# Patient Record
Sex: Female | Born: 2000 | Race: Black or African American | Hispanic: No | Marital: Single | Smoking: Never smoker
Health system: Southern US, Community
[De-identification: ages and names within clinical notes are randomized; demographics above are authoritative.]

## PROBLEM LIST (undated history)

## (undated) DIAGNOSIS — F32A Depression, unspecified: Secondary | ICD-10-CM

## (undated) DIAGNOSIS — F329 Major depressive disorder, single episode, unspecified: Secondary | ICD-10-CM

## (undated) DIAGNOSIS — F419 Anxiety disorder, unspecified: Secondary | ICD-10-CM

## (undated) HISTORY — PX: DILATION AND CURETTAGE OF UTERUS: SHX78

## (undated) HISTORY — DX: Depression, unspecified: F32.A

## (undated) HISTORY — DX: Anxiety disorder, unspecified: F41.9

## (undated) HISTORY — PX: NO PAST SURGERIES: SHX2092

---
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
## 2021-01-13 DIAGNOSIS — Z5181 Encounter for therapeutic drug level monitoring: Secondary | ICD-10-CM | POA: Diagnosis not present

## 2021-02-01 DIAGNOSIS — Z5181 Encounter for therapeutic drug level monitoring: Secondary | ICD-10-CM | POA: Diagnosis not present

## 2021-02-07 DIAGNOSIS — Z5181 Encounter for therapeutic drug level monitoring: Secondary | ICD-10-CM | POA: Diagnosis not present

## 2021-02-13 DIAGNOSIS — Z5181 Encounter for therapeutic drug level monitoring: Secondary | ICD-10-CM | POA: Diagnosis not present

## 2021-02-24 DIAGNOSIS — Z5181 Encounter for therapeutic drug level monitoring: Secondary | ICD-10-CM | POA: Diagnosis not present

## 2021-03-03 DIAGNOSIS — Z5181 Encounter for therapeutic drug level monitoring: Secondary | ICD-10-CM | POA: Diagnosis not present

## 2021-03-15 DIAGNOSIS — Z5181 Encounter for therapeutic drug level monitoring: Secondary | ICD-10-CM | POA: Diagnosis not present

## 2021-03-28 DIAGNOSIS — Z5181 Encounter for therapeutic drug level monitoring: Secondary | ICD-10-CM | POA: Diagnosis not present

## 2021-04-10 DIAGNOSIS — Z5181 Encounter for therapeutic drug level monitoring: Secondary | ICD-10-CM | POA: Diagnosis not present

## 2021-04-26 DIAGNOSIS — Z5181 Encounter for therapeutic drug level monitoring: Secondary | ICD-10-CM | POA: Diagnosis not present

## 2021-05-10 DIAGNOSIS — Z5181 Encounter for therapeutic drug level monitoring: Secondary | ICD-10-CM | POA: Diagnosis not present

## 2021-05-17 DIAGNOSIS — Z5181 Encounter for therapeutic drug level monitoring: Secondary | ICD-10-CM | POA: Diagnosis not present

## 2021-05-25 DIAGNOSIS — Z5181 Encounter for therapeutic drug level monitoring: Secondary | ICD-10-CM | POA: Diagnosis not present

## 2021-06-15 DIAGNOSIS — Z5181 Encounter for therapeutic drug level monitoring: Secondary | ICD-10-CM | POA: Diagnosis not present

## 2021-06-27 DIAGNOSIS — Z5181 Encounter for therapeutic drug level monitoring: Secondary | ICD-10-CM | POA: Diagnosis not present

## 2021-07-18 DIAGNOSIS — Z5181 Encounter for therapeutic drug level monitoring: Secondary | ICD-10-CM | POA: Diagnosis not present

## 2021-08-09 DIAGNOSIS — Z5181 Encounter for therapeutic drug level monitoring: Secondary | ICD-10-CM | POA: Diagnosis not present

## 2021-08-16 DIAGNOSIS — Z5181 Encounter for therapeutic drug level monitoring: Secondary | ICD-10-CM | POA: Diagnosis not present

## 2021-09-05 DIAGNOSIS — Z5181 Encounter for therapeutic drug level monitoring: Secondary | ICD-10-CM | POA: Diagnosis not present

## 2021-09-18 DIAGNOSIS — Z5181 Encounter for therapeutic drug level monitoring: Secondary | ICD-10-CM | POA: Diagnosis not present

## 2021-09-27 DIAGNOSIS — Z5181 Encounter for therapeutic drug level monitoring: Secondary | ICD-10-CM | POA: Diagnosis not present

## 2021-10-30 DIAGNOSIS — Z5181 Encounter for therapeutic drug level monitoring: Secondary | ICD-10-CM | POA: Diagnosis not present

## 2021-11-13 DIAGNOSIS — Z5181 Encounter for therapeutic drug level monitoring: Secondary | ICD-10-CM | POA: Diagnosis not present

## 2022-04-20 DIAGNOSIS — J020 Streptococcal pharyngitis: Secondary | ICD-10-CM | POA: Diagnosis not present

## 2023-03-01 DIAGNOSIS — M25531 Pain in right wrist: Secondary | ICD-10-CM | POA: Diagnosis not present

## 2023-03-01 DIAGNOSIS — S61511A Laceration without foreign body of right wrist, initial encounter: Secondary | ICD-10-CM | POA: Diagnosis not present

## 2023-04-25 DIAGNOSIS — Z3201 Encounter for pregnancy test, result positive: Secondary | ICD-10-CM | POA: Diagnosis not present

## 2023-04-25 DIAGNOSIS — Z124 Encounter for screening for malignant neoplasm of cervix: Secondary | ICD-10-CM | POA: Diagnosis not present

## 2023-04-25 DIAGNOSIS — N926 Irregular menstruation, unspecified: Secondary | ICD-10-CM | POA: Diagnosis not present

## 2023-05-18 DIAGNOSIS — N939 Abnormal uterine and vaginal bleeding, unspecified: Secondary | ICD-10-CM | POA: Diagnosis not present

## 2023-05-18 DIAGNOSIS — R102 Pelvic and perineal pain: Secondary | ICD-10-CM | POA: Diagnosis not present

## 2023-05-18 DIAGNOSIS — Z87891 Personal history of nicotine dependence: Secondary | ICD-10-CM | POA: Diagnosis not present

## 2023-05-18 DIAGNOSIS — E871 Hypo-osmolality and hyponatremia: Secondary | ICD-10-CM | POA: Diagnosis not present

## 2023-05-18 DIAGNOSIS — R1084 Generalized abdominal pain: Secondary | ICD-10-CM | POA: Diagnosis not present

## 2023-05-18 DIAGNOSIS — O034 Incomplete spontaneous abortion without complication: Secondary | ICD-10-CM | POA: Diagnosis not present

## 2023-05-18 DIAGNOSIS — O039 Complete or unspecified spontaneous abortion without complication: Secondary | ICD-10-CM | POA: Diagnosis not present

## 2023-05-18 DIAGNOSIS — E860 Dehydration: Secondary | ICD-10-CM | POA: Diagnosis not present

## 2024-01-10 ENCOUNTER — Ambulatory Visit (HOSPITAL_COMMUNITY)
Admission: RE | Admit: 2024-01-10 | Discharge: 2024-01-10 | Disposition: A | Discharge status: Home / Self Care | Payer: Medicaid Other | Source: Ambulatory Visit | Attending: Internal Medicine | Admitting: Internal Medicine

## 2024-01-10 ENCOUNTER — Encounter (HOSPITAL_COMMUNITY): Payer: Self-pay

## 2024-01-10 VITALS — BP 115/76 | HR 98 | Temp 98.3°F | Resp 14

## 2024-01-10 DIAGNOSIS — Z113 Encounter for screening for infections with a predominantly sexual mode of transmission: Secondary | ICD-10-CM | POA: Diagnosis not present

## 2024-01-10 LAB — HIV ANTIBODY (ROUTINE TESTING W REFLEX): HIV Screen 4th Generation wRfx: NONREACTIVE

## 2024-01-10 NOTE — Discharge Instructions (Addendum)
 Screening swab and blood work done today and results will be available in 24-48 hours. We will contact you if we need to arrange additional treatment based on your testing. Negative results will be on your MyChart account. Abstain from sex until you receive your final results.  Use a condom for sexual encounters. If you have any worsening or changing symptoms including abnormal discharge, pelvic pain, abdominal pain, fever, nausea, or vomiting, then you should be reevaluated.

## 2024-01-10 NOTE — ED Triage Notes (Signed)
Patient is requesting STD testing and states she wants blood work as well. Patient denies any symptoms and states she just has it done every so often.

## 2024-01-10 NOTE — ED Provider Notes (Signed)
[facility]    CSN: [id number] Arrival date & time: 01/10/24  1550      History   Chief Complaint Chief Complaint  Patient presents with   SEXUALLY TRANSMITTED DISEASE    I'm not sure if I have an std or an sti so I just want to be tested for everything just to be safe. - Entered by patient    HPI Angela Dominguez is a 23 y.o. female.   23 y.o. female who presents to urgent care wanting to be screened for STIs including swab and blood work.  She denies any vaginal symptoms, lower abdominal pain, dysuria, hematuria.  She denies any symptoms at all but reports that she likes to be tested every once in a while.     Past Medical History:  Diagnosis Date   Anxiety    Depression     Patient Active Problem List   Diagnosis Date Noted   [redacted] weeks gestation of pregnancy 03/22/2020   PROM (premature rupture of membranes) 03/21/2020   GBS (group B Streptococcus carrier), +RV culture, currently pregnant 03/14/2020   Anemia during pregnancy 01/21/2020   Short cervical length during pregnancy 11/22/2019   Chlamydia trachomatis infection in pregnancy 11/04/2019   Bacterial vaginosis in pregnancy 11/04/2019   Supervision of normal first pregnancy 09/24/2019    Past Surgical History:  Procedure Laterality Date   DILATION AND CURETTAGE OF UTERUS     NO PAST SURGERIES      OB History     Gravida  1   Para  1   Term  1   Preterm      AB      Living  1      SAB      IAB      Ectopic      Multiple  0   Live Births  1            Home Medications    Prior to Admission medications   Medication Sig Start Date End Date Taking? Authorizing Provider  ibuprofen (ADVIL) 600 MG tablet Take 1 tablet (600 mg total) by mouth every 8 (eight) hours as needed for mild pain. Patient not taking: Reported on 04/19/2020 03/24/20   Aviva Signs, CNM    Family History Family History  Problem Relation Age of Onset   Asthma Mother    Diabetes Maternal  Grandmother    Hypertension Maternal Grandmother    Cancer Paternal Grandmother     Social History Social History   Tobacco Use   Smoking status: Never   Smokeless tobacco: Never  Vaping Use   Vaping status: Some Days   Substances: Nicotine, Flavoring  Substance Use Topics   Alcohol use: Never   Drug use: Never     Allergies   Patient has no known allergies.   Review of Systems Review of Systems  Constitutional:  Negative for chills and fever.  HENT:  Negative for ear pain and sore throat.   Eyes:  Negative for pain and visual disturbance.  Respiratory:  Negative for cough and shortness of breath.   Cardiovascular:  Negative for chest pain and palpitations.  Gastrointestinal:  Negative for abdominal pain and vomiting.  Genitourinary:  Negative for dysuria and hematuria.  Musculoskeletal:  Negative for arthralgias and back pain.  Skin:  Negative for color change and rash.  Neurological:  Negative for seizures and syncope.  All other systems reviewed and are negative.    Physical Exam Triage  Vital Signs ED Triage Vitals  Encounter Vitals Group     BP 01/10/24 1635 115/76     Systolic BP Percentile --      Diastolic BP Percentile --      Pulse Rate 01/10/24 1635 98     Resp 01/10/24 1635 14     Temp 01/10/24 1635 98.3 F (36.8 C)     Temp Source 01/10/24 1635 Oral     SpO2 01/10/24 1635 97 %     Weight --      Height --      Head Circumference --      Peak Flow --      Pain Score 01/10/24 1636 0     Pain Loc --      Pain Education --      Exclude from Growth Chart --    No data found.  Updated Vital Signs BP 115/76 (BP Location: Right Arm)   Pulse 98   Temp 98.3 F (36.8 C) (Oral)   Resp 14   LMP 01/02/2024   SpO2 97%   Visual Acuity Right Eye Distance:   Left Eye Distance:   Bilateral Distance:    Right Eye Near:   Left Eye Near:    Bilateral Near:     Physical Exam Vitals and nursing note reviewed.  Constitutional:      General: She  is not in acute distress.    Appearance: She is well-developed.  HENT:     Head: Normocephalic and atraumatic.  Eyes:     Conjunctiva/sclera: Conjunctivae normal.  Cardiovascular:     Rate and Rhythm: Normal rate and regular rhythm.     Heart sounds: No murmur heard. Pulmonary:     Effort: Pulmonary effort is normal. No respiratory distress.     Breath sounds: Normal breath sounds.  Abdominal:     Palpations: Abdomen is soft.     Tenderness: There is no abdominal tenderness.  Musculoskeletal:        General: No swelling.     Cervical back: Neck supple.  Skin:    General: Skin is warm and dry.     Capillary Refill: Capillary refill takes less than 2 seconds.  Neurological:     Mental Status: She is alert.  Psychiatric:        Mood and Affect: Mood normal.      UC Treatments / Results  Labs (all labs ordered are listed, but only abnormal results are displayed) Labs Reviewed  RPR  HIV ANTIBODY (ROUTINE TESTING W REFLEX)  CERVICOVAGINAL ANCILLARY ONLY    EKG   Radiology No results found.  Procedures Procedures (including critical care time)  Medications Ordered in UC Medications - No data to display  Initial Impression / Assessment and Plan / UC Course  I have reviewed the triage vital signs and the nursing notes.  Pertinent labs & imaging results that were available during my care of the patient were reviewed by me and considered in my medical decision making (see chart for details).     Screening examination for STI   Screening swab and blood work done today and results will be available in 24-48 hours. We will contact you if we need to arrange additional treatment based on your testing. Negative results will be on your MyChart account. Abstain from sex until you receive your final results.  Use a condom for sexual encounters. If you have any worsening or changing symptoms including abnormal discharge, pelvic pain, abdominal pain, fever,  nausea, or vomiting,  then you should be reevaluated.   Final Clinical Impressions(s) / UC Diagnoses   Final diagnoses:  Screening examination for STI     Discharge Instructions      Screening swab and blood work done today and results will be available in 24-48 hours. We will contact you if we need to arrange additional treatment based on your testing. Negative results will be on your MyChart account. Abstain from sex until you receive your final results.  Use a condom for sexual encounters. If you have any worsening or changing symptoms including abnormal discharge, pelvic pain, abdominal pain, fever, nausea, or vomiting, then you should be reevaluated.     ED Prescriptions   None    PDMP not reviewed this encounter.   Landis Martins, [place] 01/10/24 1706

## 2024-01-11 LAB — RPR: RPR Ser Ql: NONREACTIVE

## 2024-01-13 LAB — CERVICOVAGINAL ANCILLARY ONLY
Bacterial Vaginitis (gardnerella): POSITIVE — AB
Chlamydia: NEGATIVE
Comment: NEGATIVE
Comment: NEGATIVE
Comment: NEGATIVE
Comment: NORMAL
Neisseria Gonorrhea: NEGATIVE
Trichomonas: POSITIVE — AB

## 2024-01-14 ENCOUNTER — Telehealth (HOSPITAL_COMMUNITY): Payer: Self-pay

## 2024-01-14 MED ORDER — METRONIDAZOLE 500 MG PO TABS: 500.0000 mg | ORAL_TABLET | Freq: Two times a day (BID) | ORAL | 0 refills | Status: DC

## 2024-01-14 NOTE — Telephone Encounter (Signed)
Per protocol, pt requires tx with metronidazole. Reviewed with patient, verified pharmacy, prescription sent.

## 2024-05-03 DIAGNOSIS — R3589 Other polyuria: Secondary | ICD-10-CM | POA: Diagnosis not present

## 2024-05-03 DIAGNOSIS — N3000 Acute cystitis without hematuria: Secondary | ICD-10-CM | POA: Diagnosis not present

## 2024-05-26 DIAGNOSIS — T7840XA Allergy, unspecified, initial encounter: Secondary | ICD-10-CM | POA: Diagnosis not present

## 2024-05-26 DIAGNOSIS — L509 Urticaria, unspecified: Secondary | ICD-10-CM | POA: Diagnosis not present

## 2024-06-05 ENCOUNTER — Ambulatory Visit (HOSPITAL_COMMUNITY)
Admission: EM | Admit: 2024-06-05 | Discharge: 2024-06-05 | Disposition: A | Discharge status: Home / Self Care | Attending: Emergency Medicine | Admitting: Emergency Medicine

## 2024-06-05 ENCOUNTER — Ambulatory Visit (INDEPENDENT_AMBULATORY_CARE_PROVIDER_SITE_OTHER)

## 2024-06-05 ENCOUNTER — Encounter (HOSPITAL_COMMUNITY): Payer: Self-pay

## 2024-06-05 DIAGNOSIS — M7989 Other specified soft tissue disorders: Secondary | ICD-10-CM | POA: Diagnosis not present

## 2024-06-05 DIAGNOSIS — S6991XA Unspecified injury of right wrist, hand and finger(s), initial encounter: Secondary | ICD-10-CM

## 2024-06-05 MED ORDER — IBUPROFEN 800 MG PO TABS
ORAL_TABLET | ORAL | Status: AC
  Filled 2024-06-05: qty 1

## 2024-06-05 MED ORDER — IBUPROFEN 800 MG PO TABS: 800.0000 mg | ORAL_TABLET | Freq: Three times a day (TID) | ORAL | 0 refills | Status: AC

## 2024-06-05 MED ORDER — IBUPROFEN 800 MG PO TABS
800.0000 mg | ORAL_TABLET | Freq: Once | ORAL | Status: AC
  Administered 2024-06-05: 800 mg via ORAL

## 2024-06-05 NOTE — Discharge Instructions (Addendum)
 Xray does not show any broken bones. You like have sprained the hand and fingers, and may have some bruising.   Ice - apply for 20 minutes a few times daily Compression - use ace wrap for support Elevation - prop up on a pillow  Ibuprofen  -- 1 tablet every 6 hours for pain and swelling  Please follow closely with orthopedics if persisting pain despite these interventions

## 2024-06-05 NOTE — ED Triage Notes (Signed)
 Patient presenting with right hand pain onset last night. Patient states she punched a wall. Patient unable to make a fist, hand is painful and swollen.   Prescriptions or OTC medications tried: No

## 2024-06-05 NOTE — ED Provider Notes (Addendum)
 [facility]    CSN: [id number] Arrival date & time: 06/05/24  1922      History   Chief Complaint Chief Complaint  Patient presents with   Hand Injury    HPI Angela Dominguez is a 23 y.o. female.  Here with right hand injury that occurred last night. She punched a wall. Having pain and swelling in the hand, cannot make a fist.  Pain rated 6/10 at rest Has not taken any medications yet No prior injury of hand   Right hand dominant   Past Medical History:  Diagnosis Date   Anxiety    Depression     Patient Active Problem List   Diagnosis Date Noted   [redacted] weeks gestation of pregnancy 03/22/2020   PROM (premature rupture of membranes) 03/21/2020   GBS (group B Streptococcus carrier), +RV culture, currently pregnant 03/14/2020   Anemia during pregnancy 01/21/2020   Short cervical length during pregnancy 11/22/2019   Chlamydia trachomatis infection in pregnancy 11/04/2019   Bacterial vaginosis in pregnancy 11/04/2019   Supervision of normal first pregnancy 09/24/2019    Past Surgical History:  Procedure Laterality Date   DILATION AND CURETTAGE OF UTERUS     NO PAST SURGERIES      OB History     Gravida  1   Para  1   Term  1   Preterm      AB      Living  1      SAB      IAB      Ectopic      Multiple  0   Live Births  1            Home Medications    Prior to Admission medications   Medication Sig Start Date End Date Taking? Authorizing Provider  ibuprofen  (ADVIL ) 800 MG tablet Take 1 tablet (800 mg total) by mouth 3 (three) times daily. 06/05/24  Yes Myka Hitz, Ivette Marks, PA-C    Family History Family History  Problem Relation Age of Onset   Asthma Mother    Diabetes Maternal Grandmother    Hypertension Maternal Grandmother    Cancer Paternal Grandmother     Social History Social History   Tobacco Use   Smoking status: Never   Smokeless tobacco: Never  Vaping Use   Vaping status: Some Days   Substances:  Nicotine, Flavoring  Substance Use Topics   Alcohol use: Never   Drug use: Never     Allergies   Patient has no known allergies.   Review of Systems Review of Systems As per HPI  Physical Exam Triage Vital Signs ED Triage Vitals  Encounter Vitals Group     BP 06/05/24 1936 121/76     Girls Systolic BP Percentile --      Girls Diastolic BP Percentile --      Boys Systolic BP Percentile --      Boys Diastolic BP Percentile --      Pulse Rate 06/05/24 1936 88     Resp 06/05/24 1936 16     Temp 06/05/24 1936 98.9 F (37.2 C)     Temp Source 06/05/24 1936 Oral     SpO2 06/05/24 1936 98 %     Weight 06/05/24 1935 128 lb 8 oz (58.3 kg)     Height 06/05/24 1935 5' 3 (1.6 m)     Head Circumference --      Peak Flow --      Pain Score  06/05/24 1934 6     Pain Loc --      Pain Education --      Exclude from Growth Chart --    No data found.  Updated Vital Signs BP 121/76 (BP Location: Right Arm)   Pulse 88   Temp 98.9 F (37.2 C) (Oral)   Resp 16   Ht 5' 3 (1.6 m)   Wt 128 lb 8 oz (58.3 kg)   LMP 05/18/2024 (Exact Date)   SpO2 98%   Breastfeeding No   BMI 22.76 kg/m   Visual Acuity Right Eye Distance:   Left Eye Distance:   Bilateral Distance:    Right Eye Near:   Left Eye Near:    Bilateral Near:     Physical Exam Vitals and nursing note reviewed.  Constitutional:      General: She is not in acute distress. HENT:     Mouth/Throat:     Pharynx: Oropharynx is clear.   Cardiovascular:     Rate and Rhythm: Normal rate and regular rhythm.     Pulses: Normal pulses.     Heart sounds: Normal heart sounds.  Pulmonary:     Effort: Pulmonary effort is normal.     Breath sounds: Normal breath sounds.   Musculoskeletal:        General: Tenderness present.     Cervical back: Normal range of motion.     Comments: Tender dorsal right hand, mostly ulnar side. Grip strength 4/5 compared to left hand 5/5. Distal sensation intact. Passive ROM of all finger  joints intact. Has minimal active ROM due to pain. Wrist ROM intact. Radial pulse 2+. No lacerations or wounds noted    Skin:    General: Skin is warm and dry.     Capillary Refill: Capillary refill takes less than 2 seconds.   Neurological:     Mental Status: She is alert and oriented to person, place, and time.     UC Treatments / Results  Labs (all labs ordered are listed, but only abnormal results are displayed) Labs Reviewed - No data to display  EKG  Radiology DG Hand Complete Right Result Date: 06/05/2024 CLINICAL DATA:  Injury, swelling, reduced ROM EXAM: RIGHT HAND - COMPLETE 3+ VIEW COMPARISON:  None Available. FINDINGS: There is no evidence of fracture or dislocation. There is no evidence of arthropathy or other focal bone abnormality. Soft tissues are unremarkable. IMPRESSION: Negative. Electronically Signed   By: Morgane  Naveau M.D.   On: 06/05/2024 20:15    Procedures Procedures  Medications Ordered in UC Medications  ibuprofen  (ADVIL ) tablet 800 mg (800 mg Oral Given 06/05/24 2030)    Initial Impression / Assessment and Plan / UC Course  I have reviewed the triage vital signs and the nursing notes.  Pertinent labs & imaging results that were available during my care of the patient were reviewed by me and considered in my medical decision making (see chart for details).  Right hand xray without bony abnormality. Images independently reviewed by me, agree with radiology interpretation. ACE wrap applied in clinic RICE therapy and pain control discussed. Ibuprofen  dose given before discharge. Advised following with orthopedics if pain/swelling persists despite these interventions. Patient agrees to plan, no questions   Final Clinical Impressions(s) / UC Diagnoses   Final diagnoses:  Injury of right hand, initial encounter     Discharge Instructions      Xray does not show any broken bones. You like have sprained the hand and  fingers, and may have some  bruising.   Ice - apply for 20 minutes a few times daily Compression - use ace wrap for support Elevation - prop up on a pillow  Ibuprofen  -- 1 tablet every 6 hours for pain and swelling  Please follow closely with orthopedics if persisting pain despite these interventions      ED Prescriptions     Medication Sig Dispense Auth. Provider   ibuprofen  (ADVIL ) 800 MG tablet Take 1 tablet (800 mg total) by mouth 3 (three) times daily. 21 tablet Odes Lolli, Ivette Marks, PA-C      PDMP not reviewed this encounter.   Cataleya Cristina, Beth Brooke 06/05/24 2031    Makeisha Jentsch, Ivette Marks, PA-C 06/05/24 2031
